# Patient Record
Sex: Female | Born: 1990 | Race: Black or African American | Hispanic: No | Marital: Single | Smoking: Former smoker
Health system: Southern US, Community
[De-identification: ages and names within clinical notes are randomized; demographics above are authoritative.]

---
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
## 2013-06-30 ENCOUNTER — Encounter (HOSPITAL_COMMUNITY): Payer: Self-pay | Admitting: Emergency Medicine

## 2013-06-30 ENCOUNTER — Emergency Department (HOSPITAL_COMMUNITY)
Admission: EM | Admit: 2013-06-30 | Discharge: 2013-07-01 | Disposition: A | Discharge status: Home / Self Care | Payer: BC Managed Care – PPO | Attending: Emergency Medicine | Admitting: Emergency Medicine

## 2013-06-30 DIAGNOSIS — L0501 Pilonidal cyst with abscess: Secondary | ICD-10-CM | POA: Insufficient documentation

## 2013-06-30 DIAGNOSIS — F17200 Nicotine dependence, unspecified, uncomplicated: Secondary | ICD-10-CM | POA: Insufficient documentation

## 2013-06-30 DIAGNOSIS — Z888 Allergy status to other drugs, medicaments and biological substances status: Secondary | ICD-10-CM | POA: Insufficient documentation

## 2013-06-30 NOTE — ED Notes (Signed)
Patient presents with hard red area at the top of her buttock

## 2013-07-01 MED ORDER — SULFAMETHOXAZOLE-TMP DS 800-160 MG PO TABS: 1.0000 | ORAL_TABLET | Freq: Two times a day (BID) | ORAL | Status: DC

## 2013-07-01 MED ORDER — CEPHALEXIN 500 MG PO CAPS: 500.0000 mg | ORAL_CAPSULE | Freq: Four times a day (QID) | ORAL | Status: DC

## 2013-07-01 MED ORDER — HYDROCODONE-ACETAMINOPHEN 5-325 MG PO TABS: 1.0000 | ORAL_TABLET | Freq: Four times a day (QID) | ORAL | Status: DC | PRN

## 2013-07-01 NOTE — ED Provider Notes (Signed)
Medical screening examination/treatment/procedure(s) were performed by non-physician practitioner and as supervising physician I was immediately available for consultation/collaboration.   EKG Interpretation None        Meaghan Whistler, MD 07/01/13 0534 

## 2013-07-01 NOTE — Discharge Instructions (Signed)
Warm compresses to the abscess area. Take ibuprofen for pain. Norco for severe pain only. Bactrim and Keflex for infection until all gone. Followup in 2 days for recheck and packing removal   Pilonidal Cyst A pilonidal cyst occurs when hairs get trapped (ingrown) beneath the skin in the crease between the buttocks over your sacrum (the bone under that crease). Pilonidal cysts are most common in young men with a lot of body hair. When the cyst is ruptured (breaks) or leaking, fluid from the cyst may cause burning and itching. If the cyst becomes infected, it causes a painful swelling filled with pus (abscess). The pus and trapped hairs need to be removed (often by lancing) so that the infection can heal. However, recurrence is common and an operation may be needed to remove the cyst. HOME CARE INSTRUCTIONS   If the cyst was NOT INFECTED:  Keep the area clean and dry. Bathe or shower daily. Wash the area well with a germ-killing soap. Warm tub baths may help prevent infection and help with drainage. Dry the area well with a towel.  Avoid tight clothing to keep area as moisture free as possible.  Keep area between buttocks as free of hair as possible. A depilatory may be used.  If the cyst WAS INFECTED and needed to be drained:  Your caregiver packed the wound with gauze to keep the wound open. This allows the wound to heal from the inside outwards and continue draining.  Return for a wound check in 1 day or as suggested.  If you take tub baths or showers, repack the wound with gauze following them. Sponge baths (at the sink) are a good alternative.  If an antibiotic was ordered to fight the infection, take as directed.  Only take over-the-counter or prescription medicines for pain, discomfort, or fever as directed by your caregiver.  After the drain is removed, use sitz baths for 20 minutes 4 times per day. Clean the wound gently with mild unscented soap, pat dry, and then apply a dry  dressing. SEEK MEDICAL CARE IF:   You have increased pain, swelling, redness, drainage, or bleeding from the area.  You have a fever.  You have muscles aches, dizziness, or a general ill feeling. Document Released: 02/26/2000 Document Revised: 05/23/2011 Document Reviewed: 04/25/2008 [facility] Patient Information 2014 [place], [place].

## 2013-07-01 NOTE — ED Provider Notes (Signed)
CSN: [id number]     Arrival date & time 06/30/13  2329 History   First MD Initiated Contact with Patient 07/01/13 0020     Chief Complaint  Patient presents with  . Abcess      (Consider location/radiation/quality/duration/timing/severity/associated sxs/prior Treatment) HPI Sabrina Walker is a 23 y.o. female who presents emergency department complaining of pain and swelling to the buttock. Patient states that her symptoms began several days ago. He denies any history of the same. Denies any drainage. States painful to touch and sent him that area. Denies any fever or chills. States unable to tolerate pain worse he came to emergency department. No medications tried. Nothing is making symptoms better.   History reviewed. No pertinent past medical history. History reviewed. No pertinent past surgical history. History reviewed. No pertinent family history. History  Substance Use Topics  . Smoking status: Current Every Day Smoker  . Smokeless tobacco: Never Used  . Alcohol Use: Yes   OB History   Grav Para Term Preterm Abortions TAB SAB Ect Mult Living                 Review of Systems  Constitutional: Negative for fever and chills.  Respiratory: Negative for cough, chest tightness and shortness of breath.   Cardiovascular: Negative for chest pain, palpitations and leg swelling.  Genitourinary: Negative for dysuria and flank pain.  Musculoskeletal: Negative for myalgias.  Skin: Positive for wound.  Neurological: Negative for dizziness, weakness and headaches.  All other systems reviewed and are negative.     Allergies  Dilantin  Home Medications   Prior to Admission medications   Not on File   BP 128/80  Pulse 117  Temp(Src) 99.4 F (37.4 C) (Oral)  Resp 17  Ht 5\' 7"  (1.702 m)  Wt 225 lb 2 oz (102.116 kg)  BMI 35.25 kg/m2  SpO2 98%  LMP 06/12/2013 Physical Exam  Nursing note and vitals reviewed. Constitutional: She is oriented to person, place, and time. She  appears well-developed and well-nourished. No distress.  HENT:  Head: Normocephalic.  Eyes: Conjunctivae are normal.  Neck: Neck supple.  Cardiovascular: Normal rate, regular rhythm and normal heart sounds.   Pulmonary/Chest: Effort normal and breath sounds normal. No respiratory distress. She has no wheezes. She has no rales.  Musculoskeletal: She exhibits no edema.  Neurological: She is alert and oriented to person, place, and time.  Skin: Skin is warm and dry.  Pilonidal abscess, with induration, tenderness, fluctuance to the upper gluteal cleft. Mild surrounding erythema and tenderness.  Psychiatric: She has a normal mood and affect. Her behavior is normal.    ED Course  Procedures (including critical care time) Labs Review Labs Reviewed - No data to display  Imaging Review No results found.   EKG Interpretation None      INCISION AND DRAINAGE Performed by: Myriam Jacobsonatyana A Jamir Rone Consent: Verbal consent obtained. Risks and benefits: risks, benefits and alternatives were discussed Type: abscess  Body area: pilonidal abscess  Anesthesia: local infiltration  Incision was made with a scalpel.  Local anesthetic: lidocaine 2% wo epinephrine  Anesthetic total: 4 ml  Complexity: complex Blunt dissection to break up loculations  Drainage: purulent  Drainage amount: large  Packing material: 1/4 in iodoform gauze  Patient tolerance: Patient tolerated the procedure well with no immediate complications.    MDM   Final diagnoses:  Pilonidal abscess    Patient will call and I'll abscess, drained in emergency department. Will will treat with Keflex, Bactrim,  Norco at home. Follow with primary care Dr. If unable to followup return in 2 days for packing removal and recheck.  Filed Vitals:   06/30/13 2342  BP: 128/80  Pulse: 117  Temp: 99.4 F (37.4 C)  TempSrc: Oral  Resp: 17  Height: 5\' 7"  (1.702 m)  Weight: 225 lb 2 oz (102.116 kg)  SpO2: 98%        Rashada Klontz A Hartwell Vandiver, PA-C 07/01/13 0101  Myriam Jacobsonatyana A Mathilde Mcwherter, PA-C 07/01/13 0104

## 2013-07-01 NOTE — ED Notes (Signed)
Pt c/o hard sore area at top of buttock.  Denies drainage.

## 2013-07-02 ENCOUNTER — Encounter (HOSPITAL_COMMUNITY): Payer: Self-pay | Admitting: Emergency Medicine

## 2013-07-02 ENCOUNTER — Emergency Department (HOSPITAL_COMMUNITY)
Admission: EM | Admit: 2013-07-02 | Discharge: 2013-07-02 | Disposition: A | Discharge status: Home / Self Care | Payer: BC Managed Care – PPO | Attending: Emergency Medicine | Admitting: Emergency Medicine

## 2013-07-02 DIAGNOSIS — F17200 Nicotine dependence, unspecified, uncomplicated: Secondary | ICD-10-CM | POA: Insufficient documentation

## 2013-07-02 DIAGNOSIS — Z5189 Encounter for other specified aftercare: Secondary | ICD-10-CM

## 2013-07-02 DIAGNOSIS — Z4801 Encounter for change or removal of surgical wound dressing: Secondary | ICD-10-CM | POA: Insufficient documentation

## 2013-07-02 DIAGNOSIS — H9319 Tinnitus, unspecified ear: Secondary | ICD-10-CM | POA: Insufficient documentation

## 2013-07-02 DIAGNOSIS — Z79899 Other long term (current) drug therapy: Secondary | ICD-10-CM | POA: Insufficient documentation

## 2013-07-02 DIAGNOSIS — Z792 Long term (current) use of antibiotics: Secondary | ICD-10-CM | POA: Insufficient documentation

## 2013-07-02 NOTE — Discharge Instructions (Signed)
Wound Check  Your wound appears healthy today. Your wound will heal gradually over time. Eventually a scar will form that will fade with time.  FACTORS THAT AFFECT SCAR FORMATION:   People differ in the severity in which they scar.   Scar severity varies according to location, size, and the traits you inherited from your parents (genetic predisposition).   Irritation to the wound from infection, rubbing, or chemical exposure will increase the amount of scar formation.  HOME CARE INSTRUCTIONS    If you were given a dressing, you should change it at least once a day or as instructed by your caregiver. If the bandage sticks, soak it off with a solution of hydrogen peroxide.   If the bandage becomes wet, dirty, or develops a bad smell, change it as soon as possible.   Look for signs of infection.   Only take over-the-counter or prescription medicines for pain, discomfort, or fever as directed by your caregiver.  SEEK IMMEDIATE MEDICAL CARE IF:    You have redness, swelling, or increasing pain in the wound.   You notice pus coming from the wound.   You have a fever.   You notice a bad smell coming from the wound or dressing.  Document Released: 12/05/2003 Document Revised: 05/23/2011 Document Reviewed: 02/28/2005  ExitCare Patient Information 2014 [employer].

## 2013-07-02 NOTE — ED Provider Notes (Signed)
CSN: [id number]     Arrival date & time 07/02/13  [id number] History   First MD Initiated Contact with Patient 07/02/13 [id number]     Chief Complaint  Patient presents with  . Wound Check  . Tinnitus     (Consider location/radiation/quality/duration/timing/severity/associated sxs/prior Treatment) HPI Comments: Patient here for check of Pilonidal abscess, S/P I&D 2 days ago. Doing well at home. C/o brief episode of tinnitus lasting seconds this morning. No ear pain or discharge. Resolved at this time. Denies fevers, chills, myalgias, arthralgias. Denies DOE, SOB, chest tightness or pressure, radiation to left arm, jaw or back, or diaphoresis. Denies dysuria, flank pain, suprapubic pain, frequency, urgency, or hematuria. Denies headaches, light headedness, weakness, visual disturbances. Denies abdominal pain, nausea, vomiting, diarrhea or constipation.    Patient is a 23 y.o. female presenting with wound check.  Wound Check This is a new problem. The current episode started in the past 7 days. The problem has been rapidly improving. Pertinent negatives include no abdominal pain, anorexia, arthralgias, change in bowel habit, chest pain, chills, congestion, coughing, diaphoresis, fatigue, fever, headaches, joint swelling, myalgias, nausea, neck pain, numbness, rash, sore throat, swollen glands, urinary symptoms, vertigo, visual change, vomiting or weakness. Exacerbated by: sitting. Treatments tried: abx, pain meds] The treatment provided significant relief.    History reviewed. No pertinent past medical history. History reviewed. No pertinent past surgical history. History reviewed. No pertinent family history. History  Substance Use Topics  . Smoking status: Current Every Day Smoker  . Smokeless tobacco: Never Used  . Alcohol Use: Yes     Comment: occassional   OB History   Grav Para Term Preterm Abortions TAB SAB Ect Mult Living                 Review of Systems  Constitutional: Negative  for fever, chills, diaphoresis and fatigue.  HENT: Negative for congestion, ear discharge, ear pain, sore throat and trouble swallowing.   Respiratory: Negative for cough.   Cardiovascular: Negative for chest pain.  Gastrointestinal: Negative for nausea, vomiting, abdominal pain, anorexia and change in bowel habit.  Musculoskeletal: Negative for arthralgias, joint swelling, myalgias and neck pain.  Skin: Positive for wound. Negative for rash.  Neurological: Negative for vertigo, weakness, numbness and headaches.      Allergies  Dilantin  Home Medications   Prior to Admission medications   Medication Sig Start Date End Date Taking? Authorizing Provider  cephALEXin (KEFLEX) 500 MG capsule Take 1 capsule (500 mg total) by mouth 4 (four) times daily. 07/01/13  Yes Tatyana A Kirichenko, PA-C  HYDROcodone-acetaminophen (NORCO) 5-325 MG per tablet Take 1 tablet by mouth every 6 (six) hours as needed for moderate pain. 07/01/13  Yes Tatyana A Kirichenko, PA-C  OVER THE COUNTER MEDICATION Take 1 tablet by mouth daily. "hair infinity"   Yes Historical Provider, MD  sulfamethoxazole-trimethoprim (BACTRIM DS) 800-160 MG per tablet Take 1 tablet by mouth 2 (two) times daily. 07/01/13  Yes Tatyana A Kirichenko, PA-C   BP 98/71  Pulse 94  Temp(Src) 98.1 F (36.7 C) (Oral)  Resp 17  Ht 5\' 7"  (1.702 m)  Wt 225 lb (102.059 kg)  BMI 35.23 kg/m2  SpO2 98%  LMP 06/12/2013 Physical Exam  Constitutional: She is oriented to person, place, and time. She appears well-developed and well-nourished. No distress.  HENT:  Head: Normocephalic and atraumatic.  Right Ear: Hearing, tympanic membrane, external ear and ear canal normal.  Left Ear: Hearing, tympanic membrane, external ear and ear canal  normal.  Eyes: Conjunctivae are normal. No scleral icterus.  Neck: Normal range of motion.  Cardiovascular: Normal rate, regular rhythm and normal heart sounds.  Exam reveals no gallop and no friction rub.   No  murmur heard. Pulmonary/Chest: Effort normal and breath sounds normal. No respiratory distress.  Abdominal: Soft. Bowel sounds are normal. She exhibits no distension and no mass. There is no tenderness. There is no guarding.  Neurological: She is alert and oriented to person, place, and time.  Skin: Skin is warm and dry. She is not diaphoretic.       ED Course  Wound packing Date/Time: 07/02/2013 8:27 AM Performed by: Arthor CaptainHARRIS, Ronalda Walpole Authorized by: Arthor CaptainHARRIS, Candita Borenstein Consent: Verbal consent obtained. Risks and benefits: risks, benefits and alternatives were discussed Comments: Packing removed from wound. No discharge noted. Flushed with sterile saline.  Bandage applied.   (including critical care time) Labs Review Labs Reviewed - No data to display  Imaging Review No results found.   EKG Interpretation None      MDM   Final diagnoses:  None    BP 98/71  Pulse 94  Temp(Src) 98.1 F (36.7 C) (Oral)  Resp 17  Ht 5\' 7"  (1.702 m)  Wt 225 lb (102.059 kg)  BMI 35.23 kg/m2  SpO2 98%  LMP 06/12/2013  Patient here for wound check. No signs of infection. Packing removed. Bandage applied No concern for acute abnormlaity involving the ears. The patient appears reasonably screened and/or stabilized for discharge and I doubt any other medical condition or other [facility] requiring further screening, evaluation, or treatment in the ED at this time prior to discharge.    Arthor CaptainAbigail Kamaree Berkel, PA-C 07/02/13 0830

## 2013-07-02 NOTE — ED Notes (Signed)
Pt presents via POV from home for a recheck of her wound located on the sacral area.  Pt was seen here on 06/30/13 for a abcess removal/packing.  Pt also states she had a ringing in her ears that started this morning and lasting for a short period of time.

## 2013-07-03 NOTE — ED Provider Notes (Signed)
Medical screening examination/treatment/procedure(s) were performed by non-physician practitioner and as supervising physician I was immediately available for consultation/collaboration.   EKG Interpretation None       Raeford RazorStephen Malon Branton, MD 07/03/13 1436

## 2013-09-24 ENCOUNTER — Encounter (HOSPITAL_COMMUNITY): Payer: Self-pay | Admitting: Emergency Medicine

## 2013-09-24 ENCOUNTER — Emergency Department (INDEPENDENT_AMBULATORY_CARE_PROVIDER_SITE_OTHER)
Admission: EM | Admit: 2013-09-24 | Discharge: 2013-09-24 | Disposition: A | Discharge status: Home / Self Care | Payer: Self-pay | Source: Home / Self Care | Attending: Family Medicine | Admitting: Family Medicine

## 2013-09-24 DIAGNOSIS — S139XXA Sprain of joints and ligaments of unspecified parts of neck, initial encounter: Secondary | ICD-10-CM

## 2013-09-24 DIAGNOSIS — S161XXA Strain of muscle, fascia and tendon at neck level, initial encounter: Secondary | ICD-10-CM

## 2013-09-24 MED ORDER — IBUPROFEN 800 MG PO TABS
ORAL_TABLET | ORAL | Status: AC
  Filled 2013-09-24: qty 1

## 2013-09-24 MED ORDER — IBUPROFEN 800 MG PO TABS: 800.0000 mg | ORAL_TABLET | Freq: Three times a day (TID) | ORAL | Status: DC

## 2013-09-24 MED ORDER — IBUPROFEN 800 MG PO TABS
800.0000 mg | ORAL_TABLET | Freq: Once | ORAL | Status: AC
  Administered 2013-09-24: 800 mg via ORAL
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
## 2013-09-24 NOTE — Discharge Instructions (Signed)
Cervical Sprain °A cervical sprain is an injury in the neck in which the strong, fibrous tissues (ligaments) that connect your neck bones stretch or tear. Cervical sprains can range from mild to severe. Severe cervical sprains can cause the neck vertebrae to be unstable. This can lead to damage of the spinal cord and can result in serious nervous system problems. The amount of time it takes for a cervical sprain to get better depends on the cause and extent of the injury. Most cervical sprains heal in 1 to 3 weeks. °CAUSES  °Severe cervical sprains may be caused by:  °· Contact sport injuries (such as from football, rugby, wrestling, hockey, auto racing, gymnastics, diving, martial arts, or boxing).   °· Motor vehicle collisions.   °· Whiplash injuries. This is an injury from a sudden forward and backward whipping movement of the head and neck.  °· Falls.   °Mild cervical sprains may be caused by:  °· Being in an awkward position, such as while cradling a telephone between your ear and shoulder.   °· Sitting in a chair that does not offer proper support.   °· Working at a poorly designed computer station.   °· Looking up or down for long periods of time.   °SYMPTOMS  °· Pain, soreness, stiffness, or a burning sensation in the front, back, or sides of the neck. This discomfort may develop immediately after the injury or slowly, 24 hours or more after the injury.   °· Pain or tenderness directly in the middle of the back of the neck.   °· Shoulder or upper back pain.   °· Limited ability to move the neck.   °· Headache.   °· Dizziness.   °· Weakness, numbness, or tingling in the hands or arms.   °· Muscle spasms.   °· Difficulty swallowing or chewing.   °· Tenderness and swelling of the neck.   °DIAGNOSIS  °Most of the time your health care provider can diagnose a cervical sprain by taking your history and doing a physical exam. Your health care provider will ask about previous neck injuries and any known neck  problems, such as arthritis in the neck. X-rays may be taken to find out if there are any other problems, such as with the bones of the neck. Other tests, such as a CT scan or MRI, may also be needed.  °TREATMENT  °Treatment depends on the severity of the cervical sprain. Mild sprains can be treated with rest, keeping the neck in place (immobilization), and pain medicines. Severe cervical sprains are immediately immobilized. Further treatment is done to help with pain, muscle spasms, and other symptoms and may include: °· Medicines, such as pain relievers, numbing medicines, or muscle relaxants.   °· Physical therapy. This may involve stretching exercises, strengthening exercises, and posture training. Exercises and improved posture can help stabilize the neck, strengthen muscles, and help stop symptoms from returning.   °HOME CARE INSTRUCTIONS  °· Put ice on the injured area.   °¨ Put ice in a plastic bag.   °¨ Place a towel between your skin and the bag.   °¨ Leave the ice on for 15-20 minutes, 3-4 times a day.   °· If your injury was severe, you may have been given a cervical collar to wear. A cervical collar is a two-piece collar designed to keep your neck from moving while it heals. °¨ Do not remove the collar unless instructed by your health care provider. °¨ If you have long hair, keep it outside of the collar. °¨ Ask your health care provider before making any adjustments to your collar. Minor   adjustments may be required over time to improve comfort and reduce pressure on your chin or on the back of your head.  Ifyou are allowed to remove the collar for cleaning or bathing, follow your health care provider's instructions on how to do so safely.  Keep your collar clean by wiping it with mild soap and water and drying it completely. If the collar you have been given includes removable pads, remove them every 1-2 days and hand wash them with soap and water. Allow them to air dry. They should be completely  dry before you wear them in the collar.  If you are allowed to remove the collar for cleaning and bathing, wash and dry the skin of your neck. Check your skin for irritation or sores. If you see any, tell your health care provider.  Do not drive while wearing the collar.   Only take over-the-counter or prescription medicines for pain, discomfort, or fever as directed by your health care provider.   Keep all follow-up appointments as directed by your health care provider.   Keep all physical therapy appointments as directed by your health care provider.   Make any needed adjustments to your workstation to promote good posture.   Avoid positions and activities that make your symptoms worse.   Warm up and stretch before being active to help prevent problems.  SEEK MEDICAL CARE IF:   Your pain is not controlled with medicine.   You are unable to decrease your pain medicine over time as planned.   Your activity level is not improving as expected.  SEEK IMMEDIATE MEDICAL CARE IF:   You develop any bleeding.  You develop stomach upset.  You have signs of an allergic reaction to your medicine.   Your symptoms get worse.   You develop new, unexplained symptoms.   You have numbness, tingling, weakness, or paralysis in any part of your body.  MAKE SURE YOU:   Understand these instructions.  Will watch your condition.  Will get help right away if you are not doing well or get worse. Document Released: 12/26/2006 Document Revised: 03/05/2013 Document Reviewed: 09/05/2012 [facility] Patient Information 2015 [place], [place]. This information is not intended to replace advice given to you by your health care provider. Make sure you discuss any questions you have with your health care provider.  Motor Vehicle Collision  It is common to have multiple bruises and sore muscles after a motor vehicle collision (MVC). These tend to feel worse for the first 24 hours. You may have  the most stiffness and soreness over the first several hours. You may also feel worse when you wake up the first morning after your collision. After this point, you will usually begin to improve with each day. The speed of improvement often depends on the severity of the collision, the number of injuries, and the location and nature of these injuries. HOME CARE INSTRUCTIONS   Put ice on the injured area.  Put ice in a plastic bag.  Place a towel between your skin and the bag.  Leave the ice on for 15-20 minutes, 3-4 times a day, or as directed by your health care provider.  Drink enough fluids to keep your urine clear or pale yellow. Do not drink alcohol.  Take a warm shower or bath once or twice a day. This will increase blood flow to sore muscles.  You may return to activities as directed by your caregiver. Be careful when lifting, as this may aggravate neck  or back pain.  Only take over-the-counter or prescription medicines for pain, discomfort, or fever as directed by your caregiver. Do not use aspirin. This may increase bruising and bleeding. SEEK IMMEDIATE MEDICAL CARE IF:  You have numbness, tingling, or weakness in the arms or legs.  You develop severe headaches not relieved with medicine.  You have severe neck pain, especially tenderness in the middle of the back of your neck.  You have changes in bowel or bladder control.  There is increasing pain in any area of the body.  You have shortness of breath, lightheadedness, dizziness, or fainting.  You have chest pain.  You feel sick to your stomach (nauseous), throw up (vomit), or sweat.  You have increasing abdominal discomfort.  There is blood in your urine, stool, or vomit.  You have pain in your shoulder (shoulder strap areas).  You feel your symptoms are getting worse. MAKE SURE YOU:   Understand these instructions.  Will watch your condition.  Will get help right away if you are not doing well or get  worse. Document Released: 02/28/2005 Document Revised: 03/05/2013 Document Reviewed: 07/28/2010 [facility] Patient Information 2015 [place]. This information is not intended to replace advice given to you by your health care provider. Make sure you discuss any questions you have with your health care provider.  Muscle Strain A muscle strain is an injury that occurs when a muscle is stretched beyond its normal length. Usually a small number of muscle fibers are torn when this happens. Muscle strain is rated in degrees. First-degree strains have the least amount of muscle fiber tearing and pain. Second-degree and third-degree strains have increasingly more tearing and pain.  Usually, recovery from muscle strain takes 1-2 weeks. Complete healing takes 5-6 weeks.  CAUSES  Muscle strain happens when a sudden, violent force placed on a muscle stretches it too far. This may occur with lifting, sports, or a fall.  RISK FACTORS Muscle strain is especially common in athletes.  SIGNS AND SYMPTOMS At the site of the muscle strain, there may be:  Pain.  Bruising.  Swelling.  Difficulty using the muscle due to pain or lack of normal function. DIAGNOSIS  Your health care provider will perform a physical exam and ask about your medical history. TREATMENT  Often, the best treatment for a muscle strain is resting, icing, and applying cold compresses to the injured area.  HOME CARE INSTRUCTIONS   Use the PRICE method of treatment to promote muscle healing during the first 2-3 days after your injury. The PRICE method involves:  Protecting the muscle from being injured again.  Restricting your activity and resting the injured body part.  Icing your injury. To do this, put ice in a plastic bag. Place a towel between your skin and the bag. Then, apply the ice and leave it on from 15-20 minutes each hour. After the third day, switch to moist heat packs.  Apply compression to the injured area with a  splint or elastic bandage. Be careful not to wrap it too tightly. This may interfere with blood circulation or increase swelling.  Elevate the injured body part above the level of your heart as often as you can.  Only take over-the-counter or prescription medicines for pain, discomfort, or fever as directed by your health care provider.  Warming up prior to exercise helps to prevent future muscle strains. SEEK MEDICAL CARE IF:   You have increasing pain or swelling in the injured area.  You have numbness,  tingling, or a significant loss of strength in the injured area. MAKE SURE YOU:   Understand these instructions.  Will watch your condition.  Will get help right away if you are not doing well or get worse. Document Released: 02/28/2005 Document Revised: 12/19/2012 Document Reviewed: 09/27/2012 [facility] Patient Information 2015 [place], [place]. This information is not intended to replace advice given to you by your health care provider. Make sure you discuss any questions you have with your health care provider.
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
## 2013-09-25 NOTE — ED Provider Notes (Signed)
Medical screening examination/treatment/procedure(s) were performed by non-physician practitioner and as supervising physician I was immediately available for consultation/collaboration.  Lianne Carreto, M.D.  Brantly Kalman C Laurell Coalson, MD 09/25/13 1644 

## 2017-01-12 ENCOUNTER — Other Ambulatory Visit: Payer: Self-pay | Admitting: Family

## 2017-01-12 ENCOUNTER — Ambulatory Visit
Admission: RE | Admit: 2017-01-12 | Discharge: 2017-01-12 | Disposition: A | Discharge status: Home / Self Care | Payer: Managed Care, Other (non HMO) | Source: Ambulatory Visit | Attending: Family | Admitting: Family

## 2017-01-12 DIAGNOSIS — M549 Dorsalgia, unspecified: Secondary | ICD-10-CM

## 2017-01-25 DIAGNOSIS — Z79899 Other long term (current) drug therapy: Secondary | ICD-10-CM | POA: Insufficient documentation

## 2017-01-25 DIAGNOSIS — Y999 Unspecified external cause status: Secondary | ICD-10-CM | POA: Insufficient documentation

## 2017-01-25 DIAGNOSIS — M7918 Myalgia, other site: Secondary | ICD-10-CM | POA: Diagnosis not present

## 2017-01-25 DIAGNOSIS — Z87891 Personal history of nicotine dependence: Secondary | ICD-10-CM | POA: Diagnosis not present

## 2017-01-25 DIAGNOSIS — Y9389 Activity, other specified: Secondary | ICD-10-CM | POA: Diagnosis not present

## 2017-01-25 DIAGNOSIS — Y92410 Unspecified street and highway as the place of occurrence of the external cause: Secondary | ICD-10-CM | POA: Diagnosis not present

## 2017-01-26 ENCOUNTER — Other Ambulatory Visit: Payer: Self-pay

## 2017-01-26 ENCOUNTER — Emergency Department (HOSPITAL_COMMUNITY)
Admission: EM | Admit: 2017-01-26 | Discharge: 2017-01-26 | Disposition: A | Discharge status: Home / Self Care | Payer: Managed Care, Other (non HMO) | Attending: Emergency Medicine | Admitting: Emergency Medicine

## 2017-01-26 ENCOUNTER — Encounter (HOSPITAL_COMMUNITY): Payer: Self-pay | Admitting: Emergency Medicine

## 2017-01-26 DIAGNOSIS — M7918 Myalgia, other site: Secondary | ICD-10-CM

## 2017-01-26 MED ORDER — METHOCARBAMOL 500 MG PO TABS: 500.0000 mg | ORAL_TABLET | Freq: Two times a day (BID) | ORAL | 0 refills | Status: DC

## 2017-01-26 MED ORDER — OXYCODONE-ACETAMINOPHEN 5-325 MG PO TABS
1.0000 | ORAL_TABLET | ORAL | Status: DC | PRN
  Administered 2017-01-26: 1 via ORAL
  Filled 2017-01-26: qty 1

## 2017-01-26 MED ORDER — NAPROXEN 375 MG PO TABS: 375.0000 mg | ORAL_TABLET | Freq: Two times a day (BID) | ORAL | 0 refills | Status: DC

## 2017-01-26 MED ORDER — NAPROXEN 375 MG PO TABS
375.0000 mg | ORAL_TABLET | Freq: Once | ORAL | Status: AC
  Administered 2017-01-26: 375 mg via ORAL
  Filled 2017-01-26: qty 1

## 2017-01-26 MED ORDER — METHOCARBAMOL 500 MG PO TABS
500.0000 mg | ORAL_TABLET | Freq: Once | ORAL | Status: AC
  Administered 2017-01-26: 500 mg via ORAL
  Filled 2017-01-26: qty 1
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
## 2017-01-28 ENCOUNTER — Emergency Department (HOSPITAL_COMMUNITY): Payer: Managed Care, Other (non HMO)

## 2017-01-28 ENCOUNTER — Encounter (HOSPITAL_COMMUNITY): Payer: Self-pay | Admitting: Nurse Practitioner

## 2017-01-28 ENCOUNTER — Emergency Department (HOSPITAL_COMMUNITY)
Admission: EM | Admit: 2017-01-28 | Discharge: 2017-01-28 | Disposition: A | Discharge status: Home / Self Care | Payer: Managed Care, Other (non HMO) | Attending: Emergency Medicine | Admitting: Emergency Medicine

## 2017-01-28 DIAGNOSIS — S39012D Strain of muscle, fascia and tendon of lower back, subsequent encounter: Secondary | ICD-10-CM | POA: Diagnosis not present

## 2017-01-28 DIAGNOSIS — S63502D Unspecified sprain of left wrist, subsequent encounter: Secondary | ICD-10-CM | POA: Diagnosis not present

## 2017-01-28 DIAGNOSIS — S6992XD Unspecified injury of left wrist, hand and finger(s), subsequent encounter: Secondary | ICD-10-CM | POA: Diagnosis present

## 2017-01-28 DIAGNOSIS — Z79899 Other long term (current) drug therapy: Secondary | ICD-10-CM | POA: Diagnosis not present

## 2017-01-28 DIAGNOSIS — Z87891 Personal history of nicotine dependence: Secondary | ICD-10-CM | POA: Diagnosis not present

## 2017-01-28 LAB — POC URINE PREG, ED: Preg Test, Ur: NEGATIVE

## 2017-01-28 MED ORDER — DICLOFENAC SODIUM 50 MG PO TBEC: 50.0000 mg | DELAYED_RELEASE_TABLET | Freq: Two times a day (BID) | ORAL | 0 refills | Status: AC

## 2017-01-28 MED ORDER — DIAZEPAM 5 MG PO TABS: 5.0000 mg | ORAL_TABLET | Freq: Two times a day (BID) | ORAL | 0 refills | Status: DC

## 2017-01-28 MED ORDER — OXYCODONE-ACETAMINOPHEN 5-325 MG PO TABS
1.0000 | ORAL_TABLET | Freq: Once | ORAL | Status: AC
  Administered 2017-01-28: 1 via ORAL
  Filled 2017-01-28: qty 1

## 2017-01-28 MED ORDER — DIAZEPAM 5 MG PO TABS
5.0000 mg | ORAL_TABLET | Freq: Once | ORAL | Status: AC
  Administered 2017-01-28: 5 mg via ORAL
  Filled 2017-01-28: qty 1
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
## 2017-01-28 NOTE — Discharge Instructions (Signed)
Do not drive while taking the valium as it will make you sleepy. Follow up with your doctor as scheduled. Return here as needed.
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
## 2018-03-26 ENCOUNTER — Other Ambulatory Visit: Payer: Self-pay | Admitting: Family Medicine

## 2018-03-26 ENCOUNTER — Other Ambulatory Visit: Payer: Managed Care, Other (non HMO)

## 2018-03-26 ENCOUNTER — Ambulatory Visit
Admission: RE | Admit: 2018-03-26 | Discharge: 2018-03-26 | Disposition: A | Discharge status: Home / Self Care | Payer: Self-pay | Source: Ambulatory Visit | Attending: Family Medicine | Admitting: Family Medicine

## 2018-03-26 DIAGNOSIS — R52 Pain, unspecified: Secondary | ICD-10-CM

## 2018-04-13 ENCOUNTER — Ambulatory Visit
Admission: EM | Admit: 2018-04-13 | Discharge: 2018-04-13 | Disposition: A | Discharge status: Home / Self Care | Payer: BLUE CROSS/BLUE SHIELD | Attending: Family Medicine | Admitting: Family Medicine

## 2018-04-13 DIAGNOSIS — J101 Influenza due to other identified influenza virus with other respiratory manifestations: Secondary | ICD-10-CM

## 2018-04-13 LAB — POCT INFLUENZA A/B
Influenza A, POC: POSITIVE — AB
Influenza B, POC: NEGATIVE

## 2018-04-13 MED ORDER — OSELTAMIVIR PHOSPHATE 75 MG PO CAPS: 75.0000 mg | ORAL_CAPSULE | Freq: Two times a day (BID) | ORAL | 0 refills | Status: AC

## 2018-04-13 MED ORDER — BENZONATATE 100 MG PO CAPS: 100.0000 mg | ORAL_CAPSULE | Freq: Three times a day (TID) | ORAL | 0 refills | Status: DC

## 2018-04-13 MED ORDER — FLUTICASONE PROPIONATE 50 MCG/ACT NA SUSP: 2.0000 | Freq: Every day | NASAL | 0 refills | Status: DC

## 2018-04-13 MED ORDER — ACETAMINOPHEN 325 MG PO TABS
650.0000 mg | ORAL_TABLET | Freq: Once | ORAL | Status: AC
  Administered 2018-04-13: 650 mg via ORAL

## 2018-04-13 MED ORDER — CETIRIZINE-PSEUDOEPHEDRINE ER 5-120 MG PO TB12: 1.0000 | ORAL_TABLET | Freq: Every day | ORAL | 0 refills | Status: AC

## 2018-04-13 NOTE — ED Provider Notes (Signed)
[facility]   [id number] 04/13/18 Arrival Time: 1547   CC: Flu like symptoms  SUBJECTIVE: History from: patient.  Sabrina Walker is a 28 y.o. female who presents with abrupt onset of nasal congestion, runny nose, sore throat, cough, fever, body aches, and fatigue x 3 days. Tmax of 102 at home.  Admits to positive sick exposure, unsure of exposure to the flu.  Has tried mucinex and aleve with minimal relief.  Denies aggravating factors.  Denies previous symptoms in the past.   Denies SOB, wheezing, chest pain, nausea, changes in bowel or bladder habits.    Received flu shot this year: no.  ROS: As per HPI.  History reviewed. No pertinent past medical history. History reviewed. No pertinent surgical history. Allergies  Allergen Reactions  . Dilantin [Phenytoin]     Mother died from use   No current facility-administered medications on file prior to encounter.    Current Outpatient Medications on File Prior to Encounter  Medication Sig Dispense Refill  . ALPRAZolam (XANAX) 0.5 MG tablet Take 0.5 mg by mouth at bedtime as needed for anxiety.    Marland Kitchen FLUoxetine (PROZAC) 10 MG capsule Take 10 mg by mouth daily.    Marland Kitchen lithium carbonate 150 MG capsule Take 150 mg by mouth 3 (three) times daily with meals.    Marland Kitchen amphetamine-dextroamphetamine (ADDERALL) 30 MG tablet Take 30 mg 2 (two) times daily by mouth.    . diclofenac (VOLTAREN) 50 MG EC tablet Take 1 tablet (50 mg total) 2 (two) times daily by mouth. 15 tablet 0  . PRESCRIPTION MEDICATION Take 1 tablet daily by mouth.     Social History   Socioeconomic History  . Marital status: Single    Spouse name: Not on file  . Number of children: Not on file  . Years of education: Not on file  . Highest education level: Not on file  Occupational History  . Not on file  Social Needs  . Financial resource strain: Not on file  . Food insecurity:    Worry: Not on file    Inability: Not on file  . Transportation needs:    Medical:  Not on file    Non-medical: Not on file  Tobacco Use  . Smoking status: Former Smoker    Packs/day: 0.20    Types: Cigarettes  . Smokeless tobacco: Never Used  Substance and Sexual Activity  . Alcohol use: Yes    Comment: occassional  . Drug use: No  . Sexual activity: Yes  Lifestyle  . Physical activity:    Days per week: Not on file    Minutes per session: Not on file  . Stress: Not on file  Relationships  . Social connections:    Talks on phone: Not on file    Gets together: Not on file    Attends religious service: Not on file    Active member of club or organization: Not on file    Attends meetings of clubs or organizations: Not on file    Relationship status: Not on file  . Intimate partner violence:    Fear of current or ex partner: Not on file    Emotionally abused: Not on file    Physically abused: Not on file    Forced sexual activity: Not on file  Other Topics Concern  . Not on file  Social History Narrative  . Not on file   Family History  Problem Relation Age of Onset  . Cancer Other   .  Diabetes Other   . Hypertension Other     OBJECTIVE:  Vitals:   04/13/18 1557  BP: 119/74  Pulse: (!) 125  Resp: 18  Temp: (!) 101.2 F (38.4 C)  TempSrc: Oral  SpO2: 97%     General appearance: alert; ill appearing, but nontoxic; speaking in full sentences and tolerating own secretions HEENT: NCAT; Ears: EACs clear, TMs pearly gray; Eyes: PERRL.  EOM grossly intact. Nose: nares patent with rhinorrhea, turbinates swollen and erythematous, Throat: oropharynx clear, tonsils non erythematous or enlarged, uvula midline  Neck: supple without LAD Lungs: unlabored respirations, symmetrical air entry; cough: mild; no respiratory distress; CTAB Heart: Tachycardic.  Radial pulses 2+ symmetrical bilaterally Skin: warm and dry Psychological: alert and cooperative; normal mood and affect  ASSESSMENT & PLAN:  1. Influenza A     Meds ordered this encounter    Medications  . acetaminophen (TYLENOL) tablet 650 mg  . cetirizine-pseudoephedrine (ZYRTEC-D) 5-120 MG tablet    Sig: Take 1 tablet by mouth daily.    Dispense:  30 tablet    Refill:  0    Order Specific Question:   Supervising Provider    Answer:   Eustace Moore [[id number]]  . fluticasone (FLONASE) 50 MCG/ACT nasal spray    Sig: Place 2 sprays into both nostrils daily.    Dispense:  16 g    Refill:  0    Order Specific Question:   Supervising Provider    Answer:   Eustace Moore [[id number]]  . oseltamivir (TAMIFLU) 75 MG capsule    Sig: Take 1 capsule (75 mg total) by mouth every 12 (twelve) hours.    Dispense:  10 capsule    Refill:  0    Order Specific Question:   Supervising Provider    Answer:   Eustace Moore [[id number]]  . benzonatate (TESSALON) 100 MG capsule    Sig: Take 1 capsule (100 mg total) by mouth every 8 (eight) hours.    Dispense:  21 capsule    Refill:  0    Order Specific Question:   Supervising Provider    Answer:   Eustace Moore [[id number]]    Get plenty of rest and push fluids Tamiflu prescribed.  Take as directed and to completion Tessalon Perles prescribed for cough Zyrtec-D prescribed for nasal congestion, runny nose, and/or sore throat Flonase prescribed for nasal congestion and runny nose Use medications daily for symptom relief Use OTC medications like ibuprofen or tylenol as needed fever or pain Follow up with PCP if symptoms persist Return or go to ER if you have any new or worsening symptoms fever, chills, nausea, vomiting, chest pain, cough, shortness of breath, wheezing, abdominal pain, changes in bowel or bladder habits, etc...  Reviewed expectations re: course of current medical issues. Questions answered. Outlined signs and symptoms indicating need for more acute intervention. Patient verbalized understanding. After Visit Summary given.         Rennis Harding, PA-C 04/13/18 1636

## 2018-04-13 NOTE — Discharge Instructions (Signed)
Get plenty of rest and push fluids Tamiflu prescribed.  Take as directed and to completion Tessalon Perles prescribed for cough Zyrtec-D prescribed for nasal congestion, runny nose, and/or sore throat Flonase prescribed for nasal congestion and runny nose Use medications daily for symptom relief Use OTC medications like ibuprofen or tylenol as needed fever or pain Follow up with PCP if symptoms persist Return or go to ER if you have any new or worsening symptoms fever, chills, nausea, vomiting, chest pain, cough, shortness of breath, wheezing, abdominal pain, changes in bowel or bladder habits, etc..Marland Kitchen

## 2018-04-13 NOTE — ED Triage Notes (Signed)
Pt c/o cough, fever, congestion and bodyaches x 3 days

## 2018-06-05 ENCOUNTER — Ambulatory Visit
Admission: EM | Admit: 2018-06-05 | Discharge: 2018-06-05 | Disposition: A | Discharge status: Home / Self Care | Payer: BLUE CROSS/BLUE SHIELD

## 2020-01-13 ENCOUNTER — Ambulatory Visit
Admission: EM | Admit: 2020-01-13 | Discharge: 2020-01-13 | Disposition: A | Discharge status: Home / Self Care | Payer: BLUE CROSS/BLUE SHIELD | Attending: Internal Medicine | Admitting: Internal Medicine

## 2020-01-13 ENCOUNTER — Encounter: Payer: Self-pay | Admitting: Emergency Medicine

## 2020-01-13 ENCOUNTER — Other Ambulatory Visit: Payer: Self-pay

## 2020-01-13 DIAGNOSIS — J069 Acute upper respiratory infection, unspecified: Secondary | ICD-10-CM | POA: Diagnosis not present

## 2020-01-13 MED ORDER — ALBUTEROL SULFATE HFA 108 (90 BASE) MCG/ACT IN AERS: 2.0000 | INHALATION_SPRAY | Freq: Four times a day (QID) | RESPIRATORY_TRACT | 2 refills | Status: AC | PRN

## 2020-01-13 MED ORDER — CETIRIZINE HCL 10 MG PO TABS: 10.0000 mg | ORAL_TABLET | Freq: Every day | ORAL | 0 refills | Status: AC

## 2020-01-13 MED ORDER — AEROCHAMBER PLUS FLO-VU MEDIUM MISC: 1.0000 | Freq: Once | 0 refills | Status: AC

## 2020-01-13 MED ORDER — BENZONATATE 100 MG PO CAPS: 100.0000 mg | ORAL_CAPSULE | Freq: Three times a day (TID) | ORAL | 0 refills | Status: AC

## 2020-01-13 MED ORDER — FLUTICASONE PROPIONATE 50 MCG/ACT NA SUSP: 1.0000 | Freq: Every day | NASAL | 0 refills | Status: AC

## 2020-01-13 MED ORDER — ONDANSETRON 4 MG PO TBDP: 4.0000 mg | ORAL_TABLET | Freq: Three times a day (TID) | ORAL | 0 refills | Status: AC | PRN

## 2020-01-13 NOTE — Discharge Instructions (Addendum)
Your COVID test is pending - it is important to quarantine / isolate at home until your results are back. °If you test positive and would like further evaluation for persistent or worsening symptoms, you may schedule an E-visit or virtual (video) visit throughout the [facility] MyChart app or website. ° °PLEASE NOTE: If you develop severe chest pain or shortness of breath please go to the ER or call 9-1-1 for further evaluation --> DO NOT schedule electronic or virtual visits for this. °Please call our office for further guidance / recommendations as needed. ° °For information about the Covid vaccine, please visit [email or web address] °

## 2020-01-13 NOTE — ED Provider Notes (Signed)
[facility]    CSN: [id number] Arrival date & time: 01/13/20  1200      History   Chief Complaint Chief Complaint  Patient presents with  . Cough  . Diarrhea    HPI Sabrina Walker is a 29 y.o. female  Subjective:   Sabrina Walker is a 29 y.o. female here for evaluation of a cough.  The cough is non-productive, without wheezing, dyspnea or hemoptysis and is aggravated by cough. Onset of symptoms was 1 week ago, unchanged since that time.  Associated symptoms include diarrhea, vomiting. Patient does not have a history of asthma. Patient has not had recent travel. Patient does have a history of smoking. Patient  has not had a previous chest x-ray. Patient has not had a PPD done. The following portions of the patient's history were reviewed and updated as appropriate: allergies, current medications, past family history, past medical history, past social history, past surgical history and problem list.     History reviewed. No pertinent past medical history.  There are no problems to display for this patient.   History reviewed. No pertinent surgical history.  OB History   No obstetric history on file.      Home Medications    Prior to Admission medications   Medication Sig Start Date End Date Taking? Authorizing Provider  albuterol (VENTOLIN HFA) 108 (90 Base) MCG/ACT inhaler Inhale 2 puffs into the lungs every 6 (six) hours as needed for wheezing or shortness of breath. 01/13/20   Hall-Potvin, [place], PA-C  ALPRAZolam (XANAX) 0.5 MG tablet Take 0.5 mg by mouth at bedtime as needed for anxiety.    [provider]  amphetamine-dextroamphetamine (ADDERALL) 30 MG tablet Take 30 mg 2 (two) times daily by mouth. 12/28/16   [provider]  benzonatate (TESSALON) 100 MG capsule Take 1 capsule (100 mg total) by mouth every 8 (eight) hours. 01/13/20   Hall-Potvin, [place], PA-C  cetirizine (ZYRTEC ALLERGY) 10 MG tablet Take 1 tablet (10 mg total)  by mouth daily. 01/13/20   Hall-Potvin, [place], PA-C  cetirizine-pseudoephedrine (ZYRTEC-D) 5-120 MG tablet Take 1 tablet by mouth daily. 04/13/18   Wurst, [place], PA-C  diclofenac (VOLTAREN) 50 MG EC tablet Take 1 tablet (50 mg total) 2 (two) times daily by mouth. 01/28/17   Janne Napoleon, NP  FLUoxetine (PROZAC) 10 MG capsule Take 10 mg by mouth daily.    [provider]  fluticasone (FLONASE) 50 MCG/ACT nasal spray Place 1 spray into both nostrils daily. 01/13/20   Hall-Potvin, [place], PA-C  lithium carbonate 150 MG capsule Take 150 mg by mouth 3 (three) times daily with meals.    [provider]  ondansetron (ZOFRAN ODT) 4 MG disintegrating tablet Take 1 tablet (4 mg total) by mouth every 8 (eight) hours as needed for nausea or vomiting. 01/13/20   Hall-Potvin, [place], PA-C  oseltamivir (TAMIFLU) 75 MG capsule Take 1 capsule (75 mg total) by mouth every 12 (twelve) hours. 04/13/18   Wurst, [place], PA-C  PRESCRIPTION MEDICATION Take 1 tablet daily by mouth.    [provider]  Spacer/Aero-Holding Chambers (AEROCHAMBER PLUS FLO-VU MEDIUM) MISC 1 each by Other route once for 1 dose. 01/13/20 01/13/20  Hall-Potvin, [place], PA-C    Family History Family History  Problem Relation Age of Onset  . Cancer Other   . Diabetes Other   . Hypertension Other     Social History Social History   Tobacco Use  . Smoking status: Former Smoker  Packs/day: 0.20    Types: Cigarettes  . Smokeless tobacco: Never Used  Substance Use Topics  . Alcohol use: Yes    Comment: occassional  . Drug use: No     Allergies   Dilantin [phenytoin]   Review of Systems Review of Systems  Constitutional: Negative for fatigue and fever.  HENT: Negative for ear pain, sinus pain, sore throat and voice change.   Eyes: Negative for pain, redness and visual disturbance.  Respiratory: Positive for cough. Negative for shortness of breath and wheezing.   Cardiovascular: Negative for  chest pain and palpitations.  Gastrointestinal: Positive for diarrhea and vomiting. Negative for abdominal pain.  Musculoskeletal: Negative for arthralgias and myalgias.  Skin: Negative for rash and wound.  Neurological: Negative for syncope and headaches.     Physical Exam Triage Vital Signs ED Triage Vitals  Enc Vitals Group     BP      Pulse      Resp      Temp      Temp src      SpO2      Weight      Height      Head Circumference      Peak Flow      Pain Score      Pain Loc      Pain Edu?      Excl. in GC?    No data found.  Updated Vital Signs BP 122/82 (BP Location: Left Arm)   Pulse 97   Temp 99.2 F (37.3 C) (Oral)   Resp 20   Ht 5\' 7"  (1.702 m)   Wt 278 lb (126.1 kg)   LMP 01/08/2020   SpO2 95%   BMI 43.54 kg/m   Visual Acuity Right Eye Distance:   Left Eye Distance:   Bilateral Distance:    Right Eye Near:   Left Eye Near:    Bilateral Near:     Physical Exam Constitutional:      General: She is not in acute distress.    Appearance: She is not ill-appearing or diaphoretic.  HENT:     Head: Normocephalic and atraumatic.     Mouth/Throat:     Mouth: Mucous membranes are moist.     Pharynx: Oropharynx is clear. No oropharyngeal exudate or posterior oropharyngeal erythema.  Eyes:     General: No scleral icterus.    Conjunctiva/sclera: Conjunctivae normal.     Pupils: Pupils are equal, round, and reactive to light.  Neck:     Comments: Trachea midline, negative JVD Cardiovascular:     Rate and Rhythm: Normal rate and regular rhythm.     Heart sounds: No murmur heard.  No gallop.   Pulmonary:     Effort: Pulmonary effort is normal. No respiratory distress.     Breath sounds: No wheezing, rhonchi or rales.  Abdominal:     Palpations: Abdomen is soft.     Tenderness: There is no abdominal tenderness.  Musculoskeletal:     Cervical back: Neck supple. No tenderness.  Lymphadenopathy:     Cervical: No cervical adenopathy.  Skin:     Capillary Refill: Capillary refill takes less than 2 seconds.     Coloration: Skin is not jaundiced or pale.     Findings: No rash.  Neurological:     General: No focal deficit present.     Mental Status: She is alert and oriented to person, place, and time.      UC Treatments /  Results  Labs (all labs ordered are listed, but only abnormal results are displayed) Labs Reviewed  NOVEL CORONAVIRUS, NAA    EKG   Radiology No results found.  Procedures Procedures (including critical care time)  Medications Ordered in UC Medications - No data to display  Initial Impression / Assessment and Plan / UC Course  I have reviewed the triage vital signs and the nursing notes.  Pertinent labs & imaging results that were available during my care of the patient were reviewed by me and considered in my medical decision making (see chart for details).     Patient afebrile, nontoxic, with SpO2 95%.  Covid PCR pending.  Patient to quarantine until results are back.  We will treat supportively as outlined below.  Return precautions discussed, patient verbalized understanding and is agreeable to plan. Final Clinical Impressions(s) / UC Diagnoses   Final diagnoses:  URI with cough and congestion     Discharge Instructions     Your COVID test is pending - it is important to quarantine / isolate at home until your results are back. If you test positive and would like further evaluation for persistent or worsening symptoms, you may schedule an E-visit or virtual (video) visit throughout the [facility] app or website.  PLEASE NOTE: If you develop severe chest pain or shortness of breath please go to the ER or call 9-1-1 for further evaluation --> DO NOT schedule electronic or virtual visits for this. Please call our office for further guidance / recommendations as needed.  For information about the Covid vaccine, please visit [email or web address]    ED Prescriptions     Medication Sig Dispense Auth. Provider   benzonatate (TESSALON) 100 MG capsule Take 1 capsule (100 mg total) by mouth every 8 (eight) hours. 21 capsule Hall-Potvin, [place], PA-C   cetirizine (ZYRTEC ALLERGY) 10 MG tablet Take 1 tablet (10 mg total) by mouth daily. 30 tablet Hall-Potvin, [place], PA-C   fluticasone (FLONASE) 50 MCG/ACT nasal spray Place 1 spray into both nostrils daily. 16 g Hall-Potvin, [place], PA-C   albuterol (VENTOLIN HFA) 108 (90 Base) MCG/ACT inhaler Inhale 2 puffs into the lungs every 6 (six) hours as needed for wheezing or shortness of breath. 8 g Hall-Potvin, [place], PA-C   Spacer/Aero-Holding Chambers (AEROCHAMBER PLUS FLO-VU MEDIUM) MISC 1 each by Other route once for 1 dose. 1 each Hall-Potvin, [place], PA-C   ondansetron (ZOFRAN ODT) 4 MG disintegrating tablet Take 1 tablet (4 mg total) by mouth every 8 (eight) hours as needed for nausea or vomiting. 21 tablet Hall-Potvin, [place], PA-C     PDMP not reviewed this encounter.   Hall-Potvin, [place], [place] 01/13/20 1307

## 2020-01-13 NOTE — ED Triage Notes (Signed)
Patient c/o diarrhea, chills, and cough last week.   Patient states she's had numerous episodes of diarrhea.     Patient received the Pfizer COVID-19 vaccine last week.   Patient c/o body aches and pain while coughing.   Patient has tried OTC cough and cold medications w/o any relief of symptoms.

## 2020-01-15 LAB — SARS-COV-2, NAA 2 DAY TAT

## 2020-01-15 LAB — NOVEL CORONAVIRUS, NAA: SARS-CoV-2, NAA: NOT DETECTED

## 2020-11-18 IMAGING — CR DG THORACOLUMBAR SPINE 2V
2 series · 2 of 2 positions shown · non-contrast
Comparison: 01/12/2017

CLINICAL DATA: Chronic low back pain.

EXAM:
THORACOLUMBAR SPINE 1V

[w thoraco-lumbar junction ap]
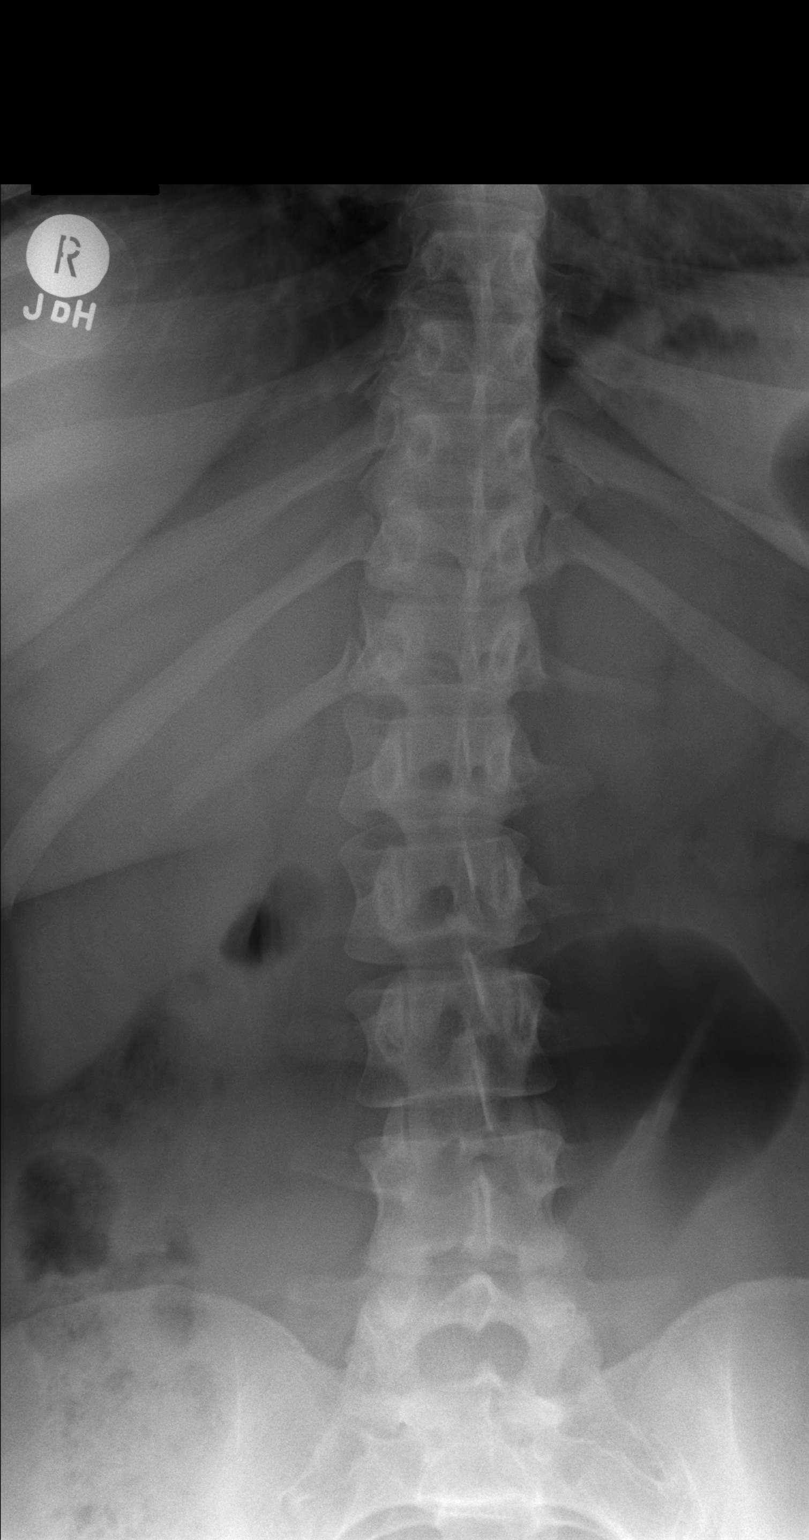

[w thoraco-lumbar junction lat]
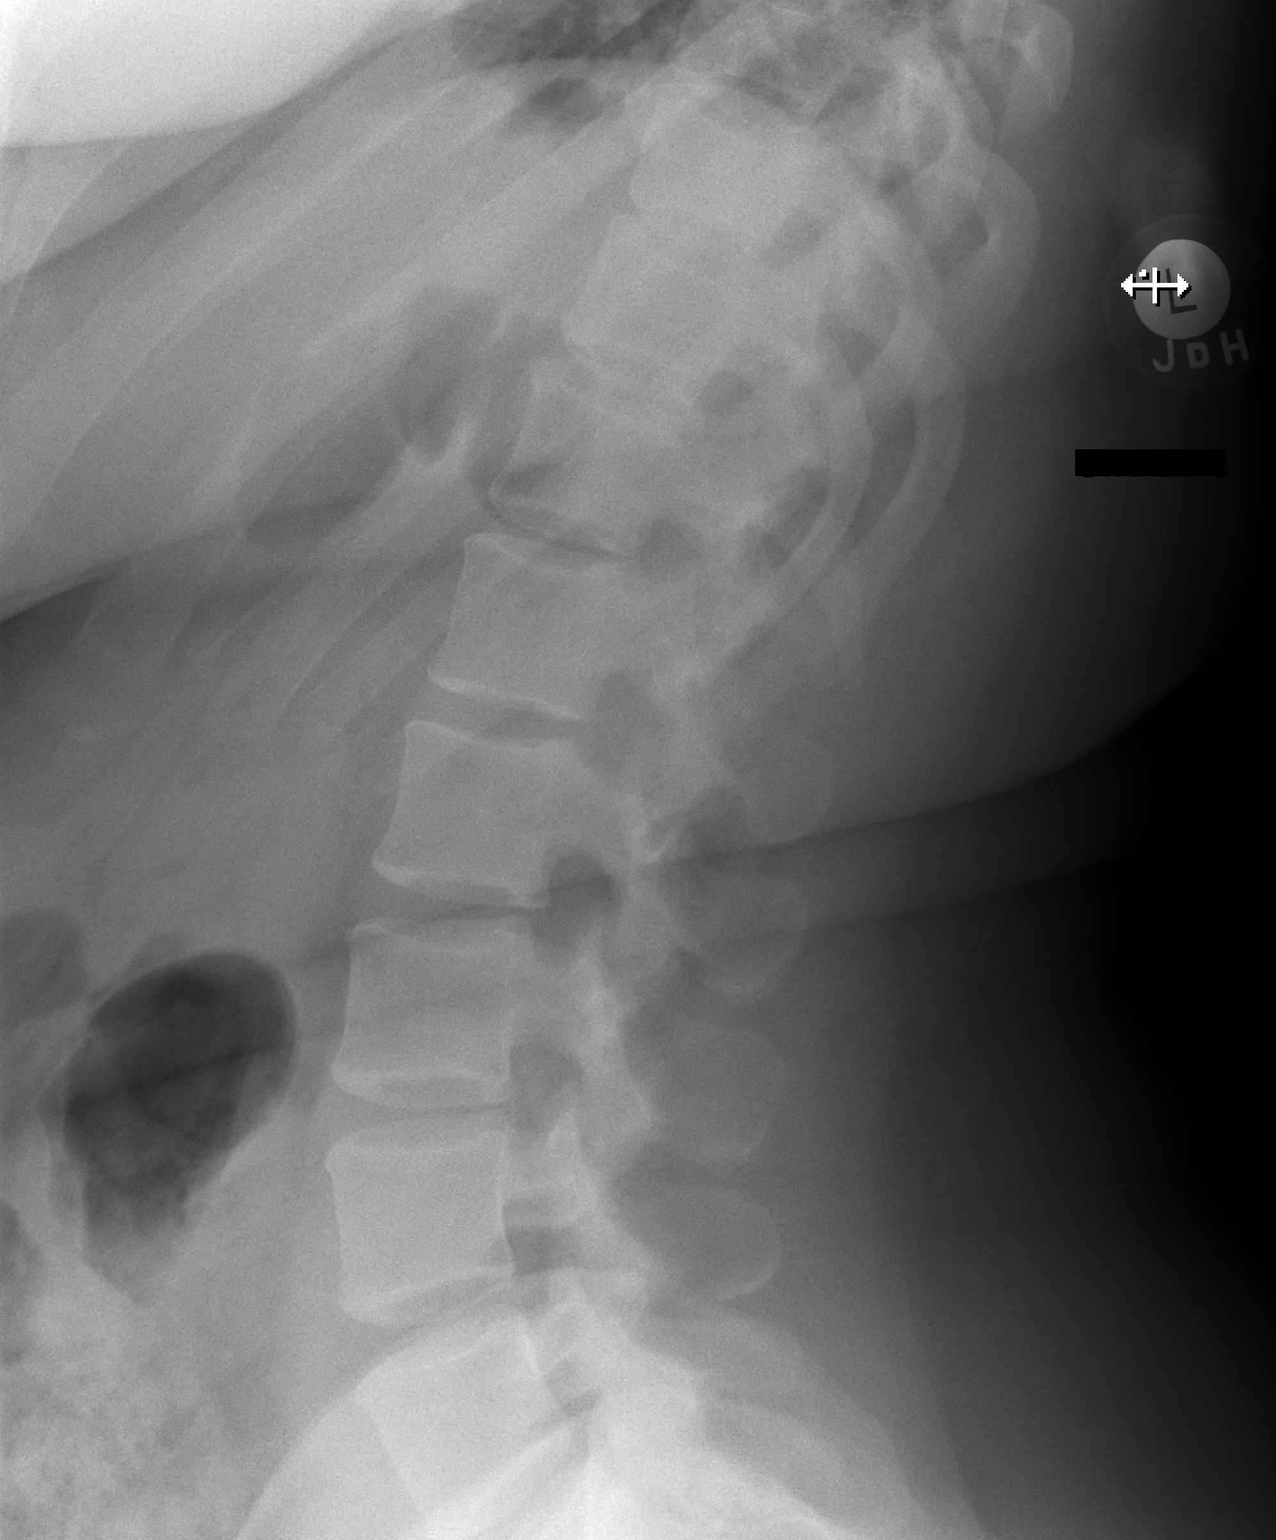

[2 of 2 positions shown; findings below may reference images not displayed]

FINDINGS: Mild rightward scoliosis centered at L1-2 measuring approximately 9
degrees. No acute or congenital bony anomaly. No fracture or
subluxation. Disc spaces are maintained.
IMPRESSION: Mild rightward scoliosis.  No acute bony abnormality.
